# Patient Record
Sex: Male | Born: 1945 | Race: White | Hispanic: No | Marital: Married | Smoking: Former smoker
Health system: Southern US, Community
[De-identification: ages and names within clinical notes are randomized; demographics above are authoritative.]

## PROBLEM LIST (undated history)

## (undated) DIAGNOSIS — G589 Mononeuropathy, unspecified: Secondary | ICD-10-CM

## (undated) DIAGNOSIS — S42409A Unspecified fracture of lower end of unspecified humerus, initial encounter for closed fracture: Secondary | ICD-10-CM

## (undated) DIAGNOSIS — K219 Gastro-esophageal reflux disease without esophagitis: Secondary | ICD-10-CM

## (undated) DIAGNOSIS — C801 Malignant (primary) neoplasm, unspecified: Secondary | ICD-10-CM

## (undated) HISTORY — DX: Gastro-esophageal reflux disease without esophagitis: K21.9

## (undated) HISTORY — DX: Mononeuropathy, unspecified: G58.9

## (undated) HISTORY — DX: Unspecified fracture of lower end of unspecified humerus, initial encounter for closed fracture: S42.409A

---

## 2011-06-19 ENCOUNTER — Other Ambulatory Visit: Payer: Self-pay

## 2011-06-19 ENCOUNTER — Emergency Department (HOSPITAL_BASED_OUTPATIENT_CLINIC_OR_DEPARTMENT_OTHER)
Admission: EM | Admit: 2011-06-19 | Discharge: 2011-06-19 | Disposition: A | Discharge status: Home / Self Care | Payer: BC Managed Care – PPO | Attending: Emergency Medicine | Admitting: Emergency Medicine

## 2011-06-19 ENCOUNTER — Emergency Department (INDEPENDENT_AMBULATORY_CARE_PROVIDER_SITE_OTHER): Payer: BC Managed Care – PPO

## 2011-06-19 DIAGNOSIS — R0602 Shortness of breath: Secondary | ICD-10-CM

## 2011-06-19 DIAGNOSIS — Z79899 Other long term (current) drug therapy: Secondary | ICD-10-CM | POA: Insufficient documentation

## 2011-06-19 DIAGNOSIS — IMO0001 Reserved for inherently not codable concepts without codable children: Secondary | ICD-10-CM

## 2011-06-19 LAB — CBC
MCH: 33.8 pg (ref 26.0–34.0)
MCHC: 35 g/dL (ref 30.0–36.0)
Platelets: 211 10*3/uL (ref 150–400)
RBC: 4.79 MIL/uL (ref 4.22–5.81)

## 2011-06-19 LAB — COMPREHENSIVE METABOLIC PANEL
ALT: 30 U/L (ref 0–53)
AST: 19 U/L (ref 0–37)
Calcium: 10.4 mg/dL (ref 8.4–10.5)
Potassium: 4.7 mEq/L (ref 3.5–5.1)
Sodium: 141 mEq/L (ref 135–145)
Total Protein: 7 g/dL (ref 6.0–8.3)

## 2011-06-19 LAB — CARDIAC PANEL(CRET KIN+CKTOT+MB+TROPI)
CK, MB: 2.6 ng/mL (ref 0.3–4.0)
Relative Index: 2.7 — ABNORMAL HIGH (ref 0.0–2.5)
Total CK: 96 U/L (ref 7–232)
Troponin I: <0.3 ng/mL (ref <0.30)
Troponin I: <0.3 ng/mL (ref <0.30)

## 2011-06-19 NOTE — ED Notes (Signed)
C/o episode of nausea, breathing difficulty, plae diaphoresis approx 430-felt better after belching-denies pain now or with event

## 2011-06-19 NOTE — ED Provider Notes (Addendum)
History     Chief Complaint  Patient presents with  . Breathing Problem   Patient is a 65 y.o. male presenting with difficulty breathing. The history is provided by the patient.  Breathing Problem This is a new problem. The current episode started 3 to 5 hours ago. The problem occurs rarely. The problem has not changed since onset.Associated symptoms include shortness of breath. Pertinent negatives include no chest pain and no abdominal pain. The symptoms are aggravated by nothing. The symptoms are relieved by nothing (burping). He has tried nothing for the symptoms.  Patient states he had momentary episode of feeling like he was short of breath and when he burped it resolved.  History reviewed. No pertinent past medical history.  History reviewed. No pertinent past surgical history.  No family history on file.  History  Substance Use Topics  . Smoking status: Former Games developer  . Smokeless tobacco: Not on file  . Alcohol Use: Yes      Review of Systems  Respiratory: Positive for shortness of breath.   Cardiovascular: Negative for chest pain.  Gastrointestinal: Negative for abdominal pain.    Physical Exam  BP 140/75  Pulse 62  Temp(Src) 98.4 F (36.9 C) (Oral)  Resp 20  Ht 5\' 9"  (1.753 m)  Wt 175 lb (79.379 kg)  BMI 25.84 kg/m2  SpO2 98%  Physical Exam  Constitutional: He is oriented to person, place, and time. He appears well-developed and well-nourished.  HENT:  Head: Normocephalic and atraumatic.  Eyes: Pupils are equal, round, and reactive to light.  Neck: Normal range of motion.  Cardiovascular: Normal rate and regular rhythm.   Pulmonary/Chest: Effort normal and breath sounds normal.  Abdominal: Soft. Bowel sounds are normal.  Musculoskeletal: Normal range of motion.  Neurological: He is alert and oriented to person, place, and time.  Skin: Skin is warm and dry.  Psychiatric: He has a normal mood and affect.    ED Course  Procedures  MDM  Date:  06/19/2011  Rate: 76  Rhythm: normal sinus rhythm  QRS Axis: normal  Intervals: normal  ST/T Wave abnormalities: normal  Conduction Disutrbances:none  Narrative Interpretation:   Old EKG Reviewed: none available   Patient with normal labs ekg and cxr.  Plan referral to pmd.    Hilario Quarry, MD 06/19/11 1610  Hilario Quarry, MD 06/19/11 9604  Hilario Quarry, MD 07/11/11 1227

## 2013-12-28 ENCOUNTER — Encounter: Payer: Self-pay | Admitting: *Deleted

## 2015-03-29 DIAGNOSIS — H2510 Age-related nuclear cataract, unspecified eye: Secondary | ICD-10-CM | POA: Diagnosis not present

## 2015-03-29 DIAGNOSIS — H5210 Myopia, unspecified eye: Secondary | ICD-10-CM | POA: Diagnosis not present

## 2015-03-29 DIAGNOSIS — Z0100 Encounter for examination of eyes and vision without abnormal findings: Secondary | ICD-10-CM | POA: Diagnosis not present

## 2015-12-19 DIAGNOSIS — H25013 Cortical age-related cataract, bilateral: Secondary | ICD-10-CM | POA: Diagnosis not present

## 2015-12-19 DIAGNOSIS — H2513 Age-related nuclear cataract, bilateral: Secondary | ICD-10-CM | POA: Diagnosis not present

## 2015-12-19 DIAGNOSIS — H25043 Posterior subcapsular polar age-related cataract, bilateral: Secondary | ICD-10-CM | POA: Diagnosis not present

## 2015-12-19 DIAGNOSIS — H2511 Age-related nuclear cataract, right eye: Secondary | ICD-10-CM | POA: Diagnosis not present

## 2016-10-13 ENCOUNTER — Emergency Department (INDEPENDENT_AMBULATORY_CARE_PROVIDER_SITE_OTHER)
Admission: EM | Admit: 2016-10-13 | Discharge: 2016-10-13 | Disposition: A | Discharge status: Home / Self Care | Payer: Self-pay | Source: Home / Self Care | Attending: Family Medicine | Admitting: Family Medicine

## 2016-10-13 ENCOUNTER — Encounter: Payer: Self-pay | Admitting: Emergency Medicine

## 2016-10-13 ENCOUNTER — Emergency Department (INDEPENDENT_AMBULATORY_CARE_PROVIDER_SITE_OTHER): Payer: Self-pay

## 2016-10-13 DIAGNOSIS — S39012A Strain of muscle, fascia and tendon of lower back, initial encounter: Secondary | ICD-10-CM

## 2016-10-13 DIAGNOSIS — S161XXA Strain of muscle, fascia and tendon at neck level, initial encounter: Secondary | ICD-10-CM

## 2016-10-13 DIAGNOSIS — M542 Cervicalgia: Secondary | ICD-10-CM
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
## 2016-10-13 NOTE — Discharge Instructions (Addendum)
Apply ice pack for 20 to 30 minutes, 2 to 3 times daily.  Begin neck range of motion and stretching exercises as tolerated.  May take Ibuprofen 200mg , 4 tabs every 8 hours with food.

## 2017-02-19 DIAGNOSIS — H2511 Age-related nuclear cataract, right eye: Secondary | ICD-10-CM | POA: Diagnosis not present

## 2017-02-20 DIAGNOSIS — H2512 Age-related nuclear cataract, left eye: Secondary | ICD-10-CM | POA: Diagnosis not present

## 2017-02-26 DIAGNOSIS — H2511 Age-related nuclear cataract, right eye: Secondary | ICD-10-CM | POA: Diagnosis not present

## 2017-02-26 DIAGNOSIS — H2512 Age-related nuclear cataract, left eye: Secondary | ICD-10-CM | POA: Diagnosis not present

## 2017-02-26 DIAGNOSIS — H251 Age-related nuclear cataract: Secondary | ICD-10-CM | POA: Diagnosis not present
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
## 2017-08-22 DIAGNOSIS — R69 Illness, unspecified: Secondary | ICD-10-CM | POA: Diagnosis not present

## 2017-09-18 DIAGNOSIS — R69 Illness, unspecified: Secondary | ICD-10-CM | POA: Diagnosis not present

## 2017-12-27 ENCOUNTER — Emergency Department
Admission: EM | Admit: 2017-12-27 | Discharge: 2017-12-27 | Disposition: A | Discharge status: Home / Self Care | Payer: Medicare HMO | Source: Home / Self Care | Attending: Family Medicine | Admitting: Family Medicine

## 2017-12-27 ENCOUNTER — Other Ambulatory Visit: Payer: Self-pay

## 2017-12-27 ENCOUNTER — Encounter: Payer: Self-pay | Admitting: Emergency Medicine

## 2017-12-27 DIAGNOSIS — J019 Acute sinusitis, unspecified: Secondary | ICD-10-CM

## 2017-12-27 DIAGNOSIS — B9689 Other specified bacterial agents as the cause of diseases classified elsewhere: Secondary | ICD-10-CM | POA: Diagnosis not present

## 2017-12-27 MED ORDER — IPRATROPIUM BROMIDE 0.06 % NA SOLN: 2.0000 | Freq: Four times a day (QID) | NASAL | 1 refills | Status: DC

## 2017-12-27 MED ORDER — AMOXICILLIN-POT CLAVULANATE 875-125 MG PO TABS: 1.0000 | ORAL_TABLET | Freq: Two times a day (BID) | ORAL | 0 refills | Status: DC

## 2017-12-27 NOTE — ED Provider Notes (Signed)
[name]    CSN: [id number] Arrival date & time: 12/27/17  8466     History   Chief Complaint Chief Complaint  Patient presents with  . Nasal Congestion    HPI Timothy Cox is a 72 y.o. male.   HPI  Timothy Cox is a 72 y.o. male presenting to UC with c/o 4-5 weeks of sinus congestion, PND and sinus pressure.  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 He is worried he has a sinus infection due to the mold. Denies fever, chills, n/v/d. He took ibuprofen 200mg  at 7AM this morning. No known sick contacts.    Past Medical History:  Diagnosis Date  . Fractured elbow   . GERD (gastroesophageal reflux disease)   . Pinched nerve     There are no active problems to display for this patient.   History reviewed. No pertinent surgical history.     Home Medications    Prior to Admission medications   Medication Sig Start Date End Date Taking? Authorizing Provider  amoxicillin-clavulanate (AUGMENTIN) 875-125 MG tablet Take 1 tablet by mouth 2 (two) times daily. One po bid x 7 days 12/27/17   Noe Gens, PA-C  aspirin 81 MG EC tablet Take 81 mg by mouth daily.      [provider]  cholecalciferol (VITAMIN D) 1000 UNITS tablet Take 1,000 Units by mouth daily.    [provider]  Garlic 599 MG TABS Take 1 tablet by mouth daily.      [provider]  ibuprofen (ADVIL,MOTRIN) 200 MG tablet Take 400 mg by mouth as needed. Pain      [provider]  ipratropium (ATROVENT) 0.06 % nasal spray Place 2 sprays into both nostrils 4 (four) times daily. 12/27/17   Noe Gens, PA-C  loratadine-pseudoephedrine (CLARITIN-D 24-HOUR) 10-240 MG per 24 hr tablet Take 1 tablet by mouth as needed. Seasonal allergies     [provider]  Multiple Vitamin (MULTIVITAMIN) tablet Take 1 tablet by mouth daily.      [provider]    Family History Family History  Problem Relation Age  of Onset  . Diabetes Mellitus I Mother   . Heart disease Mother     Social History Social History   Tobacco Use  . Smoking status: Former Smoker    Last attempt to quit: 04/27/2010    Years since quitting: 7.6  . Smokeless tobacco: Never Used  Substance Use Topics  . Alcohol use: Yes  . Drug use: Not on file     Allergies   Patient has no known allergies.   Review of Systems Review of Systems  Constitutional: Negative for chills and fever.  HENT: Positive for congestion, ear pain, postnasal drip, sinus pressure, sinus pain and sneezing. Negative for sore throat, trouble swallowing and voice change.   Respiratory: Negative for cough and shortness of breath.   Cardiovascular: Negative for chest pain and palpitations.  Gastrointestinal: Negative for abdominal pain, diarrhea, nausea and vomiting.  Musculoskeletal: Negative for arthralgias, back pain and myalgias.  Skin: Negative for rash.  Neurological: Positive for headaches. Negative for dizziness and light-headedness.     Physical Exam Triage Vital Signs ED Triage Vitals  Enc Vitals Group     BP 12/27/17 0950 136/65     Pulse Rate 12/27/17 0950 62     Resp 12/27/17 0950 18     Temp 12/27/17 0950 98 F (  36.7 C)     Temp Source 12/27/17 0950 Oral     SpO2 12/27/17 0950 97 %     Weight 12/27/17 0951 162 lb (73.5 kg)     Height 12/27/17 0951 5\' 9"  (1.753 m)     Head Circumference --      Peak Flow --      Pain Score --      Pain Loc --      Pain Edu? --      Excl. in [place]? --    No data found.  Updated Vital Signs BP 136/65 (BP Location: Right Arm)   Pulse 62   Temp 98 F (36.7 C) (Oral)   Resp 18   Ht 5\' 9"  (1.753 m)   Wt 162 lb (73.5 kg)   SpO2 97%   BMI 23.92 kg/m   Visual Acuity Right Eye Distance:   Left Eye Distance:   Bilateral Distance:    Right Eye Near:   Left Eye Near:    Bilateral Near:     Physical Exam  Constitutional: He is oriented to person, place, and time. He appears  well-developed and well-nourished. No distress.  HENT:  Head: Normocephalic and atraumatic.  Right Ear: Tympanic membrane normal.  Left Ear: Tympanic membrane normal.  Nose: Mucosal edema present. Right sinus exhibits maxillary sinus tenderness and frontal sinus tenderness. Left sinus exhibits maxillary sinus tenderness and frontal sinus tenderness.  Mouth/Throat: Uvula is midline and mucous membranes are normal. Posterior oropharyngeal erythema present. No oropharyngeal exudate, posterior oropharyngeal edema or tonsillar abscesses.  Eyes: EOM are normal.  Neck: Normal range of motion. Neck supple.  Cardiovascular: Normal rate and regular rhythm.  Pulmonary/Chest: Effort normal and breath sounds normal. No stridor. No respiratory distress. He has no wheezes. He has no rales.  Musculoskeletal: Normal range of motion.  Lymphadenopathy:    He has cervical adenopathy (Right anterior).  Neurological: He is alert and oriented to person, place, and time.  Skin: Skin is warm and dry. He is not diaphoretic.  Psychiatric: He has a normal mood and affect. His behavior is normal.  Nursing note and vitals reviewed.    UC Treatments / Results  Labs (all labs ordered are listed, but only abnormal results are displayed) Labs Reviewed - No data to display  EKG  EKG Interpretation None       Radiology No results found.  Procedures Procedures (including critical care time)  Medications Ordered in UC Medications - No data to display   Initial Impression / Assessment and Plan / UC Course  I have reviewed the triage vital signs and the nursing notes.  Pertinent labs & imaging results that were available during my care of the patient were reviewed by me and considered in my medical decision making (see chart for details).    Hx and exam c/w sinusitis Will tx with Augmentin  F/u with PCP in 1 week if needed.   Final Clinical Impressions(s) / UC Diagnoses   Final diagnoses:  Acute  bacterial rhinosinusitis    ED Discharge Orders        Ordered    amoxicillin-clavulanate (AUGMENTIN) 875-125 MG tablet  2 times daily     12/27/17 1009    ipratropium (ATROVENT) 0.06 % nasal spray  4 times daily     12/27/17 1009       Controlled Substance Prescriptions [place] Controlled Substance Registry consulted? Not Applicable   Tyrell Antonio 12/27/17 1037

## 2017-12-27 NOTE — Discharge Instructions (Signed)
°  Please take antibiotics as prescribed and be sure to complete entire course even if you start to feel better to ensure infection does not come back. ° °

## 2017-12-27 NOTE — ED Triage Notes (Signed)
Reports congested sinuses with PND for 4-5 weeks; inhaled mold recently and feels he has a sinus infection. Took ibuprofen 200 mg po at 0700.

## 2017-12-29 ENCOUNTER — Telehealth: Payer: Self-pay | Admitting: Emergency Medicine

## 2017-12-29 NOTE — Telephone Encounter (Signed)
Spoke with patient he is improving continues to take medication.

## 2018-01-09 ENCOUNTER — Telehealth: Payer: Self-pay | Admitting: *Deleted

## 2018-01-09 MED ORDER — AMOXICILLIN-POT CLAVULANATE 875-125 MG PO TABS: 1.0000 | ORAL_TABLET | Freq: Two times a day (BID) | ORAL | 0 refills | Status: DC

## 2018-01-09 MED ORDER — FLUTICASONE PROPIONATE 50 MCG/ACT NA SUSP: 2.0000 | Freq: Every day | NASAL | 0 refills | Status: DC

## 2018-01-09 NOTE — Telephone Encounter (Signed)
Pt came into the clinic request more ABT and a different nasal spray. Per Dr Assunta Found sent new rx. If he fails to improve f/u with PCP.

## 2018-01-17 ENCOUNTER — Encounter: Payer: Self-pay | Admitting: Emergency Medicine

## 2018-01-17 ENCOUNTER — Emergency Department
Admission: EM | Admit: 2018-01-17 | Discharge: 2018-01-17 | Disposition: A | Discharge status: Home / Self Care | Payer: Medicare HMO | Source: Home / Self Care | Attending: Family Medicine | Admitting: Family Medicine

## 2018-01-17 ENCOUNTER — Other Ambulatory Visit: Payer: Self-pay

## 2018-01-17 DIAGNOSIS — R590 Localized enlarged lymph nodes: Secondary | ICD-10-CM

## 2018-01-17 DIAGNOSIS — C099 Malignant neoplasm of tonsil, unspecified: Secondary | ICD-10-CM | POA: Diagnosis not present

## 2018-01-17 DIAGNOSIS — J019 Acute sinusitis, unspecified: Secondary | ICD-10-CM | POA: Diagnosis not present

## 2018-01-17 DIAGNOSIS — C109 Malignant neoplasm of oropharynx, unspecified: Secondary | ICD-10-CM | POA: Diagnosis not present

## 2018-01-17 DIAGNOSIS — J351 Hypertrophy of tonsils: Secondary | ICD-10-CM

## 2018-01-17 NOTE — ED Provider Notes (Signed)
[facility]    CSN: [id number] Arrival date & time: 01/17/18  0845     History   Chief Complaint Chief Complaint  Patient presents with  . Sinusitis    HPI Timothy Cox is a 72 y.o. male.   Patient was treated on 12/27/17 for sinusitis with amoxicillin and Atrovent nasal spray.  He reports slight improvement in his congestion after treatment, but complains of persistent swelling in his neck, mild throat soreness on the right, and hoarseness.  No fevers, chills, and sweats.  No cough. He continue to smoke.   The history is provided by the patient.    Past Medical History:  Diagnosis Date  . Fractured elbow   . GERD (gastroesophageal reflux disease)   . Pinched nerve     There are no active problems to display for this patient.   History reviewed. No pertinent surgical history.     Home Medications    Prior to Admission medications   Medication Sig Start Date End Date Taking? Authorizing Provider  aspirin 81 MG EC tablet Take 81 mg by mouth daily.      [provider]  cholecalciferol (VITAMIN D) 1000 UNITS tablet Take 1,000 Units by mouth daily.    [provider]  fluticasone (FLONASE) 50 MCG/ACT nasal spray Place 2 sprays into both nostrils daily for 15 days. 01/09/18 01/24/18  Kandra Nicolas, MD  Garlic 017 MG TABS Take 1 tablet by mouth daily.      [provider]  ibuprofen (ADVIL,MOTRIN) 200 MG tablet Take 400 mg by mouth as needed. Pain      [provider]  ipratropium (ATROVENT) 0.06 % nasal spray Place 2 sprays into both nostrils 4 (four) times daily. 12/27/17   Noe Gens, PA-C  loratadine-pseudoephedrine (CLARITIN-D 24-HOUR) 10-240 MG per 24 hr tablet Take 1 tablet by mouth as needed. Seasonal allergies     [provider]  Multiple Vitamin (MULTIVITAMIN) tablet Take 1 tablet by mouth daily.      [provider]    Family History Family History  Problem Relation Age of Onset  .  Diabetes Mellitus I Mother   . Heart disease Mother     Social History Social History   Tobacco Use  . Smoking status: Former Smoker    Last attempt to quit: 04/27/2010    Years since quitting: 7.7  . Smokeless tobacco: Never Used  Substance Use Topics  . Alcohol use: Yes  . Drug use: Not on file     Allergies   Patient has no known allergies.   Review of Systems Review of Systems + sore throat No cough No pleuritic pain No wheezing + nasal congestion + post-nasal drainage ? sinus pain/pressure No itchy/red eyes No earache No hemoptysis No SOB No fever/chills No nausea No vomiting No abdominal pain No diarrhea No urinary symptoms No skin rash No fatigue No myalgias No headache Used OTC meds without relief   Physical Exam Triage Vital Signs ED Triage Vitals  Enc Vitals Group     BP 01/17/18 0924 138/76     Pulse Rate 01/17/18 0924 69     Resp --      Temp 01/17/18 0924 98.1 F (36.7 C)     Temp Source 01/17/18 0924 Oral     SpO2 01/17/18 0924 98 %     Weight 01/17/18 0925 158 lb (71.7 kg)     Height 01/17/18 0925 5\' 9"  (1.753 m)  Head Circumference --      Peak Flow --      Pain Score --      Pain Loc --      Pain Edu? --      Excl. in [place]? --    No data found.  Updated Vital Signs BP 138/76 (BP Location: Right Arm)   Pulse 69   Temp 98.1 F (36.7 C) (Oral)   Ht 5\' 9"  (1.753 m)   Wt 158 lb (71.7 kg)   SpO2 98%   BMI 23.33 kg/m   Visual Acuity Right Eye Distance:   Left Eye Distance:   Bilateral Distance:    Right Eye Near:   Left Eye Near:    Bilateral Near:     Physical Exam  Constitutional: He appears well-developed and well-nourished. No distress.  HENT:  Head: Normocephalic.    Right Ear: Tympanic membrane, external ear and ear canal normal.  Left Ear: Tympanic membrane, external ear and ear canal normal.  Mouth/Throat: Uvula swelling present. Posterior oropharyngeal erythema present. Tonsils are 3+ on the right.  Tonsils are 1+ on the left.    Matted nontender right post-auricular node.  Firm, nontender right lateral cervical node. Left lateral nodes prominent.  Right tonsil erythematous and swollen.  Eyes: EOM are normal. Pupils are equal, round, and reactive to light. Right eye exhibits no discharge. Left eye exhibits no discharge.  Neck: Neck supple.  Cardiovascular: Normal heart sounds.  Pulmonary/Chest: Breath sounds normal.  Musculoskeletal: He exhibits no edema.  Neurological: He is alert.  Skin: Skin is warm and dry.  Nursing note and vitals reviewed.    UC Treatments / Results  Labs (all labs ordered are listed, but only abnormal results are displayed) Labs Reviewed - No data to display  EKG  EKG Interpretation None       Radiology No results found.  Procedures Procedures (including critical care time)  Medications Ordered in UC Medications - No data to display   Initial Impression / Assessment and Plan / UC Course  I have reviewed the triage vital signs and the nursing notes.  Pertinent labs & imaging results that were available during my care of the patient were reviewed by me and considered in my medical decision making (see chart for details).    Concern for throat CA. Will refer to Dr. Jannifer Franklin (ENT) 2:15pm today for further evaluation    Final Clinical Impressions(s) / UC Diagnoses   Final diagnoses:  Cervical adenopathy  Tonsillar hypertrophy, unilateral    ED Discharge Orders    None           Kandra Nicolas, MD 01/17/18 1109

## 2018-01-17 NOTE — ED Triage Notes (Signed)
Sinus congestion with green mucus, works around horses and hay with mold. Has been going on for several weeks.

## 2018-01-18 ENCOUNTER — Telehealth: Payer: Self-pay | Admitting: Emergency Medicine

## 2018-01-18 NOTE — Telephone Encounter (Signed)
Patient reports he did go to ENT yesterday and they took a biopsy of lesion and will know by 01/21/18 if there is a cancerous lesion.

## 2018-01-24 DIAGNOSIS — F17200 Nicotine dependence, unspecified, uncomplicated: Secondary | ICD-10-CM | POA: Diagnosis not present

## 2018-01-24 DIAGNOSIS — J3089 Other allergic rhinitis: Secondary | ICD-10-CM | POA: Diagnosis not present

## 2018-01-24 DIAGNOSIS — Z8052 Family history of malignant neoplasm of bladder: Secondary | ICD-10-CM | POA: Diagnosis not present

## 2018-01-24 DIAGNOSIS — C099 Malignant neoplasm of tonsil, unspecified: Secondary | ICD-10-CM | POA: Diagnosis not present

## 2018-01-24 DIAGNOSIS — F17210 Nicotine dependence, cigarettes, uncomplicated: Secondary | ICD-10-CM | POA: Diagnosis not present

## 2018-01-24 DIAGNOSIS — Z801 Family history of malignant neoplasm of trachea, bronchus and lung: Secondary | ICD-10-CM | POA: Diagnosis not present

## 2018-01-24 DIAGNOSIS — Z803 Family history of malignant neoplasm of breast: Secondary | ICD-10-CM | POA: Diagnosis not present

## 2018-01-30 DIAGNOSIS — R918 Other nonspecific abnormal finding of lung field: Secondary | ICD-10-CM | POA: Diagnosis not present

## 2018-01-30 DIAGNOSIS — K7290 Hepatic failure, unspecified without coma: Secondary | ICD-10-CM | POA: Diagnosis not present

## 2018-01-30 DIAGNOSIS — C109 Malignant neoplasm of oropharynx, unspecified: Secondary | ICD-10-CM | POA: Diagnosis not present

## 2018-01-30 DIAGNOSIS — R590 Localized enlarged lymph nodes: Secondary | ICD-10-CM | POA: Diagnosis not present

## 2018-01-30 DIAGNOSIS — C099 Malignant neoplasm of tonsil, unspecified: Secondary | ICD-10-CM | POA: Diagnosis not present

## 2018-01-31 DIAGNOSIS — C109 Malignant neoplasm of oropharynx, unspecified: Secondary | ICD-10-CM | POA: Diagnosis not present

## 2018-01-31 DIAGNOSIS — G4730 Sleep apnea, unspecified: Secondary | ICD-10-CM | POA: Diagnosis not present

## 2018-01-31 DIAGNOSIS — Z87891 Personal history of nicotine dependence: Secondary | ICD-10-CM | POA: Diagnosis not present

## 2018-02-05 DIAGNOSIS — C099 Malignant neoplasm of tonsil, unspecified: Secondary | ICD-10-CM | POA: Diagnosis not present

## 2018-02-05 DIAGNOSIS — F17200 Nicotine dependence, unspecified, uncomplicated: Secondary | ICD-10-CM | POA: Diagnosis not present

## 2018-02-11 DIAGNOSIS — Z452 Encounter for adjustment and management of vascular access device: Secondary | ICD-10-CM | POA: Diagnosis not present

## 2018-02-11 DIAGNOSIS — C099 Malignant neoplasm of tonsil, unspecified: Secondary | ICD-10-CM | POA: Diagnosis not present

## 2018-02-24 DIAGNOSIS — Z5111 Encounter for antineoplastic chemotherapy: Secondary | ICD-10-CM | POA: Diagnosis not present

## 2018-02-24 DIAGNOSIS — T451X5A Adverse effect of antineoplastic and immunosuppressive drugs, initial encounter: Secondary | ICD-10-CM | POA: Diagnosis not present

## 2018-02-24 DIAGNOSIS — C770 Secondary and unspecified malignant neoplasm of lymph nodes of head, face and neck: Secondary | ICD-10-CM | POA: Diagnosis not present

## 2018-02-24 DIAGNOSIS — C099 Malignant neoplasm of tonsil, unspecified: Secondary | ICD-10-CM | POA: Diagnosis not present

## 2018-02-24 DIAGNOSIS — D701 Agranulocytosis secondary to cancer chemotherapy: Secondary | ICD-10-CM | POA: Diagnosis not present

## 2018-02-24 DIAGNOSIS — K121 Other forms of stomatitis: Secondary | ICD-10-CM | POA: Diagnosis not present

## 2018-02-24 DIAGNOSIS — R911 Solitary pulmonary nodule: Secondary | ICD-10-CM | POA: Diagnosis not present

## 2018-02-24 DIAGNOSIS — G893 Neoplasm related pain (acute) (chronic): Secondary | ICD-10-CM | POA: Diagnosis not present

## 2018-02-24 DIAGNOSIS — Z801 Family history of malignant neoplasm of trachea, bronchus and lung: Secondary | ICD-10-CM | POA: Diagnosis not present

## 2018-02-24 DIAGNOSIS — R591 Generalized enlarged lymph nodes: Secondary | ICD-10-CM | POA: Diagnosis not present

## 2018-02-24 DIAGNOSIS — Z87891 Personal history of nicotine dependence: Secondary | ICD-10-CM | POA: Diagnosis not present

## 2018-02-24 DIAGNOSIS — C7801 Secondary malignant neoplasm of right lung: Secondary | ICD-10-CM | POA: Diagnosis not present

## 2018-02-24 DIAGNOSIS — Z8052 Family history of malignant neoplasm of bladder: Secondary | ICD-10-CM | POA: Diagnosis not present

## 2018-02-24 DIAGNOSIS — R112 Nausea with vomiting, unspecified: Secondary | ICD-10-CM | POA: Diagnosis not present

## 2018-02-24 DIAGNOSIS — C771 Secondary and unspecified malignant neoplasm of intrathoracic lymph nodes: Secondary | ICD-10-CM | POA: Diagnosis not present

## 2018-02-28 DIAGNOSIS — R591 Generalized enlarged lymph nodes: Secondary | ICD-10-CM | POA: Diagnosis not present

## 2018-02-28 DIAGNOSIS — C099 Malignant neoplasm of tonsil, unspecified: Secondary | ICD-10-CM | POA: Diagnosis not present

## 2018-02-28 DIAGNOSIS — G893 Neoplasm related pain (acute) (chronic): Secondary | ICD-10-CM | POA: Diagnosis not present

## 2018-02-28 DIAGNOSIS — D701 Agranulocytosis secondary to cancer chemotherapy: Secondary | ICD-10-CM | POA: Diagnosis not present

## 2018-02-28 DIAGNOSIS — R112 Nausea with vomiting, unspecified: Secondary | ICD-10-CM | POA: Diagnosis not present

## 2018-02-28 DIAGNOSIS — Z5111 Encounter for antineoplastic chemotherapy: Secondary | ICD-10-CM | POA: Diagnosis not present

## 2018-02-28 DIAGNOSIS — T451X5A Adverse effect of antineoplastic and immunosuppressive drugs, initial encounter: Secondary | ICD-10-CM | POA: Diagnosis not present

## 2018-02-28 DIAGNOSIS — R911 Solitary pulmonary nodule: Secondary | ICD-10-CM | POA: Diagnosis not present

## 2018-03-05 DIAGNOSIS — R591 Generalized enlarged lymph nodes: Secondary | ICD-10-CM | POA: Diagnosis not present

## 2018-03-05 DIAGNOSIS — Z5111 Encounter for antineoplastic chemotherapy: Secondary | ICD-10-CM | POA: Diagnosis not present

## 2018-03-05 DIAGNOSIS — R112 Nausea with vomiting, unspecified: Secondary | ICD-10-CM | POA: Diagnosis not present

## 2018-03-05 DIAGNOSIS — R911 Solitary pulmonary nodule: Secondary | ICD-10-CM | POA: Diagnosis not present

## 2018-03-05 DIAGNOSIS — G893 Neoplasm related pain (acute) (chronic): Secondary | ICD-10-CM | POA: Diagnosis not present

## 2018-03-05 DIAGNOSIS — T451X5A Adverse effect of antineoplastic and immunosuppressive drugs, initial encounter: Secondary | ICD-10-CM | POA: Diagnosis not present

## 2018-03-05 DIAGNOSIS — C099 Malignant neoplasm of tonsil, unspecified: Secondary | ICD-10-CM | POA: Diagnosis not present

## 2018-03-05 DIAGNOSIS — D701 Agranulocytosis secondary to cancer chemotherapy: Secondary | ICD-10-CM | POA: Diagnosis not present

## 2018-03-14 DIAGNOSIS — R112 Nausea with vomiting, unspecified: Secondary | ICD-10-CM | POA: Diagnosis not present

## 2018-03-14 DIAGNOSIS — C099 Malignant neoplasm of tonsil, unspecified: Secondary | ICD-10-CM | POA: Diagnosis not present

## 2018-03-14 DIAGNOSIS — R591 Generalized enlarged lymph nodes: Secondary | ICD-10-CM | POA: Diagnosis not present

## 2018-03-14 DIAGNOSIS — R911 Solitary pulmonary nodule: Secondary | ICD-10-CM | POA: Diagnosis not present

## 2018-03-14 DIAGNOSIS — T451X5A Adverse effect of antineoplastic and immunosuppressive drugs, initial encounter: Secondary | ICD-10-CM | POA: Diagnosis not present

## 2018-03-14 DIAGNOSIS — D701 Agranulocytosis secondary to cancer chemotherapy: Secondary | ICD-10-CM | POA: Diagnosis not present

## 2018-03-14 DIAGNOSIS — Z5111 Encounter for antineoplastic chemotherapy: Secondary | ICD-10-CM | POA: Diagnosis not present

## 2018-03-14 DIAGNOSIS — G893 Neoplasm related pain (acute) (chronic): Secondary | ICD-10-CM | POA: Diagnosis not present

## 2018-03-17 DIAGNOSIS — C7801 Secondary malignant neoplasm of right lung: Secondary | ICD-10-CM | POA: Diagnosis not present

## 2018-03-17 DIAGNOSIS — C770 Secondary and unspecified malignant neoplasm of lymph nodes of head, face and neck: Secondary | ICD-10-CM | POA: Diagnosis not present

## 2018-03-17 DIAGNOSIS — T451X5A Adverse effect of antineoplastic and immunosuppressive drugs, initial encounter: Secondary | ICD-10-CM | POA: Diagnosis not present

## 2018-03-17 DIAGNOSIS — C099 Malignant neoplasm of tonsil, unspecified: Secondary | ICD-10-CM | POA: Diagnosis not present

## 2018-03-17 DIAGNOSIS — D701 Agranulocytosis secondary to cancer chemotherapy: Secondary | ICD-10-CM | POA: Diagnosis not present

## 2018-03-17 DIAGNOSIS — R911 Solitary pulmonary nodule: Secondary | ICD-10-CM | POA: Diagnosis not present

## 2018-03-17 DIAGNOSIS — R591 Generalized enlarged lymph nodes: Secondary | ICD-10-CM | POA: Diagnosis not present

## 2018-03-17 DIAGNOSIS — K121 Other forms of stomatitis: Secondary | ICD-10-CM | POA: Diagnosis not present

## 2018-03-17 DIAGNOSIS — R112 Nausea with vomiting, unspecified: Secondary | ICD-10-CM | POA: Diagnosis not present

## 2018-03-17 DIAGNOSIS — C771 Secondary and unspecified malignant neoplasm of intrathoracic lymph nodes: Secondary | ICD-10-CM | POA: Diagnosis not present

## 2018-03-17 DIAGNOSIS — G893 Neoplasm related pain (acute) (chronic): Secondary | ICD-10-CM | POA: Diagnosis not present

## 2018-03-17 DIAGNOSIS — Z5111 Encounter for antineoplastic chemotherapy: Secondary | ICD-10-CM | POA: Diagnosis not present

## 2018-03-20 DIAGNOSIS — Z5111 Encounter for antineoplastic chemotherapy: Secondary | ICD-10-CM | POA: Diagnosis not present

## 2018-03-20 DIAGNOSIS — Z87891 Personal history of nicotine dependence: Secondary | ICD-10-CM | POA: Diagnosis not present

## 2018-03-20 DIAGNOSIS — C099 Malignant neoplasm of tonsil, unspecified: Secondary | ICD-10-CM | POA: Diagnosis not present

## 2018-03-20 DIAGNOSIS — D6959 Other secondary thrombocytopenia: Secondary | ICD-10-CM | POA: Diagnosis not present

## 2018-03-20 DIAGNOSIS — D701 Agranulocytosis secondary to cancer chemotherapy: Secondary | ICD-10-CM | POA: Diagnosis not present

## 2018-03-20 DIAGNOSIS — T451X5A Adverse effect of antineoplastic and immunosuppressive drugs, initial encounter: Secondary | ICD-10-CM | POA: Diagnosis not present

## 2018-03-20 DIAGNOSIS — D6481 Anemia due to antineoplastic chemotherapy: Secondary | ICD-10-CM | POA: Diagnosis not present

## 2018-03-20 DIAGNOSIS — G893 Neoplasm related pain (acute) (chronic): Secondary | ICD-10-CM | POA: Diagnosis not present

## 2018-03-26 DIAGNOSIS — C109 Malignant neoplasm of oropharynx, unspecified: Secondary | ICD-10-CM | POA: Diagnosis not present

## 2018-03-26 DIAGNOSIS — Z87891 Personal history of nicotine dependence: Secondary | ICD-10-CM | POA: Diagnosis not present

## 2018-03-26 DIAGNOSIS — C099 Malignant neoplasm of tonsil, unspecified: Secondary | ICD-10-CM | POA: Diagnosis not present

## 2018-04-04 DIAGNOSIS — Z87891 Personal history of nicotine dependence: Secondary | ICD-10-CM | POA: Diagnosis not present

## 2018-04-04 DIAGNOSIS — T451X5A Adverse effect of antineoplastic and immunosuppressive drugs, initial encounter: Secondary | ICD-10-CM | POA: Diagnosis not present

## 2018-04-04 DIAGNOSIS — Z5111 Encounter for antineoplastic chemotherapy: Secondary | ICD-10-CM | POA: Diagnosis not present

## 2018-04-04 DIAGNOSIS — D701 Agranulocytosis secondary to cancer chemotherapy: Secondary | ICD-10-CM | POA: Diagnosis not present

## 2018-04-04 DIAGNOSIS — D6481 Anemia due to antineoplastic chemotherapy: Secondary | ICD-10-CM | POA: Diagnosis not present

## 2018-04-04 DIAGNOSIS — C099 Malignant neoplasm of tonsil, unspecified: Secondary | ICD-10-CM | POA: Diagnosis not present

## 2018-04-04 DIAGNOSIS — D6959 Other secondary thrombocytopenia: Secondary | ICD-10-CM | POA: Diagnosis not present

## 2018-04-04 DIAGNOSIS — G893 Neoplasm related pain (acute) (chronic): Secondary | ICD-10-CM | POA: Diagnosis not present

## 2018-04-07 DIAGNOSIS — C771 Secondary and unspecified malignant neoplasm of intrathoracic lymph nodes: Secondary | ICD-10-CM | POA: Diagnosis not present

## 2018-04-07 DIAGNOSIS — T451X5A Adverse effect of antineoplastic and immunosuppressive drugs, initial encounter: Secondary | ICD-10-CM | POA: Diagnosis not present

## 2018-04-07 DIAGNOSIS — R112 Nausea with vomiting, unspecified: Secondary | ICD-10-CM | POA: Diagnosis not present

## 2018-04-07 DIAGNOSIS — D701 Agranulocytosis secondary to cancer chemotherapy: Secondary | ICD-10-CM | POA: Diagnosis not present

## 2018-04-07 DIAGNOSIS — Z87891 Personal history of nicotine dependence: Secondary | ICD-10-CM | POA: Diagnosis not present

## 2018-04-07 DIAGNOSIS — D6959 Other secondary thrombocytopenia: Secondary | ICD-10-CM | POA: Diagnosis not present

## 2018-04-07 DIAGNOSIS — G893 Neoplasm related pain (acute) (chronic): Secondary | ICD-10-CM | POA: Diagnosis not present

## 2018-04-07 DIAGNOSIS — D6481 Anemia due to antineoplastic chemotherapy: Secondary | ICD-10-CM | POA: Diagnosis not present

## 2018-04-07 DIAGNOSIS — C099 Malignant neoplasm of tonsil, unspecified: Secondary | ICD-10-CM | POA: Diagnosis not present

## 2018-04-07 DIAGNOSIS — Z5111 Encounter for antineoplastic chemotherapy: Secondary | ICD-10-CM | POA: Diagnosis not present

## 2018-04-11 DIAGNOSIS — T451X5A Adverse effect of antineoplastic and immunosuppressive drugs, initial encounter: Secondary | ICD-10-CM | POA: Diagnosis not present

## 2018-04-11 DIAGNOSIS — D6481 Anemia due to antineoplastic chemotherapy: Secondary | ICD-10-CM | POA: Diagnosis not present

## 2018-04-11 DIAGNOSIS — Z87891 Personal history of nicotine dependence: Secondary | ICD-10-CM | POA: Diagnosis not present

## 2018-04-11 DIAGNOSIS — C099 Malignant neoplasm of tonsil, unspecified: Secondary | ICD-10-CM | POA: Diagnosis not present

## 2018-04-11 DIAGNOSIS — D701 Agranulocytosis secondary to cancer chemotherapy: Secondary | ICD-10-CM | POA: Diagnosis not present

## 2018-04-11 DIAGNOSIS — G893 Neoplasm related pain (acute) (chronic): Secondary | ICD-10-CM | POA: Diagnosis not present

## 2018-04-11 DIAGNOSIS — D6959 Other secondary thrombocytopenia: Secondary | ICD-10-CM | POA: Diagnosis not present

## 2018-04-11 DIAGNOSIS — Z5111 Encounter for antineoplastic chemotherapy: Secondary | ICD-10-CM | POA: Diagnosis not present

## 2018-04-18 DIAGNOSIS — C099 Malignant neoplasm of tonsil, unspecified: Secondary | ICD-10-CM | POA: Diagnosis not present

## 2018-04-18 DIAGNOSIS — K4090 Unilateral inguinal hernia, without obstruction or gangrene, not specified as recurrent: Secondary | ICD-10-CM | POA: Diagnosis not present

## 2018-04-18 DIAGNOSIS — R590 Localized enlarged lymph nodes: Secondary | ICD-10-CM | POA: Diagnosis not present

## 2018-04-18 DIAGNOSIS — R918 Other nonspecific abnormal finding of lung field: Secondary | ICD-10-CM | POA: Diagnosis not present

## 2018-04-18 DIAGNOSIS — K5900 Constipation, unspecified: Secondary | ICD-10-CM | POA: Diagnosis not present

## 2018-04-18 DIAGNOSIS — N281 Cyst of kidney, acquired: Secondary | ICD-10-CM | POA: Diagnosis not present

## 2018-04-18 DIAGNOSIS — C109 Malignant neoplasm of oropharynx, unspecified: Secondary | ICD-10-CM | POA: Diagnosis not present

## 2018-04-18 DIAGNOSIS — C760 Malignant neoplasm of head, face and neck: Secondary | ICD-10-CM | POA: Diagnosis not present

## 2018-04-18 DIAGNOSIS — K7290 Hepatic failure, unspecified without coma: Secondary | ICD-10-CM | POA: Diagnosis not present

## 2018-04-18 DIAGNOSIS — J8410 Pulmonary fibrosis, unspecified: Secondary | ICD-10-CM | POA: Diagnosis not present

## 2018-04-18 DIAGNOSIS — N200 Calculus of kidney: Secondary | ICD-10-CM | POA: Diagnosis not present

## 2018-04-25 DIAGNOSIS — R112 Nausea with vomiting, unspecified: Secondary | ICD-10-CM | POA: Diagnosis not present

## 2018-04-25 DIAGNOSIS — D6481 Anemia due to antineoplastic chemotherapy: Secondary | ICD-10-CM | POA: Diagnosis not present

## 2018-04-25 DIAGNOSIS — T451X5A Adverse effect of antineoplastic and immunosuppressive drugs, initial encounter: Secondary | ICD-10-CM | POA: Diagnosis not present

## 2018-04-25 DIAGNOSIS — D701 Agranulocytosis secondary to cancer chemotherapy: Secondary | ICD-10-CM | POA: Diagnosis not present

## 2018-04-25 DIAGNOSIS — Z5111 Encounter for antineoplastic chemotherapy: Secondary | ICD-10-CM | POA: Diagnosis not present

## 2018-04-25 DIAGNOSIS — G893 Neoplasm related pain (acute) (chronic): Secondary | ICD-10-CM | POA: Diagnosis not present

## 2018-04-25 DIAGNOSIS — C099 Malignant neoplasm of tonsil, unspecified: Secondary | ICD-10-CM | POA: Diagnosis not present

## 2018-04-25 DIAGNOSIS — C799 Secondary malignant neoplasm of unspecified site: Secondary | ICD-10-CM | POA: Diagnosis not present

## 2018-04-25 DIAGNOSIS — D6959 Other secondary thrombocytopenia: Secondary | ICD-10-CM | POA: Diagnosis not present

## 2018-04-28 DIAGNOSIS — K219 Gastro-esophageal reflux disease without esophagitis: Secondary | ICD-10-CM | POA: Diagnosis not present

## 2018-04-28 DIAGNOSIS — T451X5D Adverse effect of antineoplastic and immunosuppressive drugs, subsequent encounter: Secondary | ICD-10-CM | POA: Diagnosis not present

## 2018-04-28 DIAGNOSIS — C099 Malignant neoplasm of tonsil, unspecified: Secondary | ICD-10-CM | POA: Diagnosis not present

## 2018-04-28 DIAGNOSIS — D6481 Anemia due to antineoplastic chemotherapy: Secondary | ICD-10-CM | POA: Diagnosis not present

## 2018-04-28 DIAGNOSIS — D701 Agranulocytosis secondary to cancer chemotherapy: Secondary | ICD-10-CM | POA: Diagnosis not present

## 2018-04-28 DIAGNOSIS — C770 Secondary and unspecified malignant neoplasm of lymph nodes of head, face and neck: Secondary | ICD-10-CM | POA: Diagnosis not present

## 2018-04-28 DIAGNOSIS — Z8052 Family history of malignant neoplasm of bladder: Secondary | ICD-10-CM | POA: Diagnosis not present

## 2018-04-28 DIAGNOSIS — Z862 Personal history of diseases of the blood and blood-forming organs and certain disorders involving the immune mechanism: Secondary | ICD-10-CM | POA: Diagnosis not present

## 2018-04-28 DIAGNOSIS — R112 Nausea with vomiting, unspecified: Secondary | ICD-10-CM | POA: Diagnosis not present

## 2018-04-28 DIAGNOSIS — T451X5A Adverse effect of antineoplastic and immunosuppressive drugs, initial encounter: Secondary | ICD-10-CM | POA: Diagnosis not present

## 2018-04-28 DIAGNOSIS — Z87891 Personal history of nicotine dependence: Secondary | ICD-10-CM | POA: Diagnosis not present

## 2018-04-28 DIAGNOSIS — C799 Secondary malignant neoplasm of unspecified site: Secondary | ICD-10-CM | POA: Diagnosis not present

## 2018-04-28 DIAGNOSIS — G893 Neoplasm related pain (acute) (chronic): Secondary | ICD-10-CM | POA: Diagnosis not present

## 2018-04-28 DIAGNOSIS — K1230 Oral mucositis (ulcerative), unspecified: Secondary | ICD-10-CM | POA: Diagnosis not present

## 2018-04-28 DIAGNOSIS — D6959 Other secondary thrombocytopenia: Secondary | ICD-10-CM | POA: Diagnosis not present

## 2018-04-28 DIAGNOSIS — Z5111 Encounter for antineoplastic chemotherapy: Secondary | ICD-10-CM | POA: Diagnosis not present

## 2018-05-02 DIAGNOSIS — C799 Secondary malignant neoplasm of unspecified site: Secondary | ICD-10-CM | POA: Diagnosis not present

## 2018-05-02 DIAGNOSIS — D6481 Anemia due to antineoplastic chemotherapy: Secondary | ICD-10-CM | POA: Diagnosis not present

## 2018-05-02 DIAGNOSIS — G893 Neoplasm related pain (acute) (chronic): Secondary | ICD-10-CM | POA: Diagnosis not present

## 2018-05-02 DIAGNOSIS — C099 Malignant neoplasm of tonsil, unspecified: Secondary | ICD-10-CM | POA: Diagnosis not present

## 2018-05-02 DIAGNOSIS — R112 Nausea with vomiting, unspecified: Secondary | ICD-10-CM | POA: Diagnosis not present

## 2018-05-02 DIAGNOSIS — Z5111 Encounter for antineoplastic chemotherapy: Secondary | ICD-10-CM | POA: Diagnosis not present

## 2018-05-02 DIAGNOSIS — D701 Agranulocytosis secondary to cancer chemotherapy: Secondary | ICD-10-CM | POA: Diagnosis not present

## 2018-05-02 DIAGNOSIS — T451X5A Adverse effect of antineoplastic and immunosuppressive drugs, initial encounter: Secondary | ICD-10-CM | POA: Diagnosis not present

## 2018-05-02 DIAGNOSIS — D6959 Other secondary thrombocytopenia: Secondary | ICD-10-CM | POA: Diagnosis not present

## 2018-05-16 DIAGNOSIS — R112 Nausea with vomiting, unspecified: Secondary | ICD-10-CM | POA: Diagnosis not present

## 2018-05-16 DIAGNOSIS — C799 Secondary malignant neoplasm of unspecified site: Secondary | ICD-10-CM | POA: Diagnosis not present

## 2018-05-16 DIAGNOSIS — D701 Agranulocytosis secondary to cancer chemotherapy: Secondary | ICD-10-CM | POA: Diagnosis not present

## 2018-05-16 DIAGNOSIS — T451X5A Adverse effect of antineoplastic and immunosuppressive drugs, initial encounter: Secondary | ICD-10-CM | POA: Diagnosis not present

## 2018-05-16 DIAGNOSIS — D6959 Other secondary thrombocytopenia: Secondary | ICD-10-CM | POA: Diagnosis not present

## 2018-05-16 DIAGNOSIS — G893 Neoplasm related pain (acute) (chronic): Secondary | ICD-10-CM | POA: Diagnosis not present

## 2018-05-16 DIAGNOSIS — C099 Malignant neoplasm of tonsil, unspecified: Secondary | ICD-10-CM | POA: Diagnosis not present

## 2018-05-16 DIAGNOSIS — Z5111 Encounter for antineoplastic chemotherapy: Secondary | ICD-10-CM | POA: Diagnosis not present

## 2018-05-16 DIAGNOSIS — D6481 Anemia due to antineoplastic chemotherapy: Secondary | ICD-10-CM | POA: Diagnosis not present

## 2018-05-19 DIAGNOSIS — R112 Nausea with vomiting, unspecified: Secondary | ICD-10-CM | POA: Diagnosis not present

## 2018-05-19 DIAGNOSIS — K1230 Oral mucositis (ulcerative), unspecified: Secondary | ICD-10-CM | POA: Diagnosis not present

## 2018-05-19 DIAGNOSIS — T451X5A Adverse effect of antineoplastic and immunosuppressive drugs, initial encounter: Secondary | ICD-10-CM | POA: Diagnosis not present

## 2018-05-19 DIAGNOSIS — Z9889 Other specified postprocedural states: Secondary | ICD-10-CM | POA: Diagnosis not present

## 2018-05-19 DIAGNOSIS — C099 Malignant neoplasm of tonsil, unspecified: Secondary | ICD-10-CM | POA: Diagnosis not present

## 2018-05-19 DIAGNOSIS — K219 Gastro-esophageal reflux disease without esophagitis: Secondary | ICD-10-CM | POA: Diagnosis not present

## 2018-05-19 DIAGNOSIS — I10 Essential (primary) hypertension: Secondary | ICD-10-CM | POA: Diagnosis not present

## 2018-05-23 DIAGNOSIS — K1230 Oral mucositis (ulcerative), unspecified: Secondary | ICD-10-CM | POA: Diagnosis not present

## 2018-05-23 DIAGNOSIS — C099 Malignant neoplasm of tonsil, unspecified: Secondary | ICD-10-CM | POA: Diagnosis not present

## 2018-05-28 DIAGNOSIS — C099 Malignant neoplasm of tonsil, unspecified: Secondary | ICD-10-CM | POA: Diagnosis not present

## 2018-06-06 DIAGNOSIS — C099 Malignant neoplasm of tonsil, unspecified: Secondary | ICD-10-CM | POA: Diagnosis not present

## 2018-06-09 DIAGNOSIS — R112 Nausea with vomiting, unspecified: Secondary | ICD-10-CM | POA: Diagnosis not present

## 2018-06-09 DIAGNOSIS — Z9889 Other specified postprocedural states: Secondary | ICD-10-CM | POA: Diagnosis not present

## 2018-06-09 DIAGNOSIS — I10 Essential (primary) hypertension: Secondary | ICD-10-CM | POA: Diagnosis not present

## 2018-06-09 DIAGNOSIS — T451X5A Adverse effect of antineoplastic and immunosuppressive drugs, initial encounter: Secondary | ICD-10-CM | POA: Diagnosis not present

## 2018-06-09 DIAGNOSIS — Z5111 Encounter for antineoplastic chemotherapy: Secondary | ICD-10-CM | POA: Diagnosis not present

## 2018-06-09 DIAGNOSIS — C099 Malignant neoplasm of tonsil, unspecified: Secondary | ICD-10-CM | POA: Diagnosis not present

## 2018-06-09 DIAGNOSIS — K219 Gastro-esophageal reflux disease without esophagitis: Secondary | ICD-10-CM | POA: Diagnosis not present

## 2018-06-09 DIAGNOSIS — D6481 Anemia due to antineoplastic chemotherapy: Secondary | ICD-10-CM | POA: Diagnosis not present

## 2018-06-09 DIAGNOSIS — K1230 Oral mucositis (ulcerative), unspecified: Secondary | ICD-10-CM | POA: Diagnosis not present

## 2018-06-09 DIAGNOSIS — Z87891 Personal history of nicotine dependence: Secondary | ICD-10-CM | POA: Diagnosis not present

## 2018-06-13 DIAGNOSIS — C099 Malignant neoplasm of tonsil, unspecified: Secondary | ICD-10-CM | POA: Diagnosis not present

## 2018-06-13 DIAGNOSIS — Z452 Encounter for adjustment and management of vascular access device: Secondary | ICD-10-CM | POA: Diagnosis not present

## 2018-07-07 DIAGNOSIS — Z9889 Other specified postprocedural states: Secondary | ICD-10-CM | POA: Diagnosis not present

## 2018-07-07 DIAGNOSIS — I10 Essential (primary) hypertension: Secondary | ICD-10-CM | POA: Diagnosis not present

## 2018-07-07 DIAGNOSIS — Z95828 Presence of other vascular implants and grafts: Secondary | ICD-10-CM | POA: Diagnosis not present

## 2018-07-07 DIAGNOSIS — Z87891 Personal history of nicotine dependence: Secondary | ICD-10-CM | POA: Diagnosis not present

## 2018-07-07 DIAGNOSIS — C099 Malignant neoplasm of tonsil, unspecified: Secondary | ICD-10-CM | POA: Diagnosis not present

## 2018-07-07 DIAGNOSIS — K219 Gastro-esophageal reflux disease without esophagitis: Secondary | ICD-10-CM | POA: Diagnosis not present

## 2018-07-16 DIAGNOSIS — K4090 Unilateral inguinal hernia, without obstruction or gangrene, not specified as recurrent: Secondary | ICD-10-CM | POA: Diagnosis not present

## 2018-07-16 DIAGNOSIS — C099 Malignant neoplasm of tonsil, unspecified: Secondary | ICD-10-CM | POA: Diagnosis not present

## 2018-07-16 DIAGNOSIS — N200 Calculus of kidney: Secondary | ICD-10-CM | POA: Diagnosis not present

## 2018-07-16 DIAGNOSIS — K7290 Hepatic failure, unspecified without coma: Secondary | ICD-10-CM | POA: Diagnosis not present

## 2018-07-16 DIAGNOSIS — C109 Malignant neoplasm of oropharynx, unspecified: Secondary | ICD-10-CM | POA: Diagnosis not present

## 2018-07-30 DIAGNOSIS — H6123 Impacted cerumen, bilateral: Secondary | ICD-10-CM | POA: Diagnosis not present

## 2018-07-30 DIAGNOSIS — C109 Malignant neoplasm of oropharynx, unspecified: Secondary | ICD-10-CM | POA: Diagnosis not present

## 2018-08-01 DIAGNOSIS — K219 Gastro-esophageal reflux disease without esophagitis: Secondary | ICD-10-CM | POA: Diagnosis not present

## 2018-08-01 DIAGNOSIS — C099 Malignant neoplasm of tonsil, unspecified: Secondary | ICD-10-CM | POA: Diagnosis not present

## 2018-08-01 DIAGNOSIS — Z95828 Presence of other vascular implants and grafts: Secondary | ICD-10-CM | POA: Diagnosis not present

## 2018-08-01 DIAGNOSIS — I10 Essential (primary) hypertension: Secondary | ICD-10-CM | POA: Diagnosis not present

## 2018-08-12 DIAGNOSIS — Z9221 Personal history of antineoplastic chemotherapy: Secondary | ICD-10-CM | POA: Diagnosis not present

## 2018-08-12 DIAGNOSIS — R030 Elevated blood-pressure reading, without diagnosis of hypertension: Secondary | ICD-10-CM | POA: Diagnosis not present

## 2018-08-12 DIAGNOSIS — C099 Malignant neoplasm of tonsil, unspecified: Secondary | ICD-10-CM | POA: Diagnosis not present

## 2018-09-24 DIAGNOSIS — C109 Malignant neoplasm of oropharynx, unspecified: Secondary | ICD-10-CM | POA: Diagnosis not present

## 2018-09-24 DIAGNOSIS — J019 Acute sinusitis, unspecified: Secondary | ICD-10-CM | POA: Diagnosis not present

## 2018-09-29 DIAGNOSIS — Z452 Encounter for adjustment and management of vascular access device: Secondary | ICD-10-CM | POA: Diagnosis not present

## 2018-09-29 DIAGNOSIS — C099 Malignant neoplasm of tonsil, unspecified: Secondary | ICD-10-CM | POA: Diagnosis not present

## 2018-10-01 DIAGNOSIS — C099 Malignant neoplasm of tonsil, unspecified: Secondary | ICD-10-CM | POA: Diagnosis not present

## 2018-10-01 DIAGNOSIS — C109 Malignant neoplasm of oropharynx, unspecified: Secondary | ICD-10-CM | POA: Diagnosis not present

## 2018-10-01 DIAGNOSIS — C781 Secondary malignant neoplasm of mediastinum: Secondary | ICD-10-CM | POA: Diagnosis not present

## 2018-10-01 DIAGNOSIS — Z9221 Personal history of antineoplastic chemotherapy: Secondary | ICD-10-CM | POA: Diagnosis not present

## 2018-10-01 DIAGNOSIS — R918 Other nonspecific abnormal finding of lung field: Secondary | ICD-10-CM | POA: Diagnosis not present

## 2018-10-02 DIAGNOSIS — C108 Malignant neoplasm of overlapping sites of oropharynx: Secondary | ICD-10-CM | POA: Diagnosis not present

## 2018-10-02 DIAGNOSIS — Z9221 Personal history of antineoplastic chemotherapy: Secondary | ICD-10-CM | POA: Diagnosis not present

## 2018-10-02 DIAGNOSIS — C778 Secondary and unspecified malignant neoplasm of lymph nodes of multiple regions: Secondary | ICD-10-CM | POA: Diagnosis not present

## 2018-10-02 DIAGNOSIS — F17211 Nicotine dependence, cigarettes, in remission: Secondary | ICD-10-CM | POA: Diagnosis not present

## 2018-10-02 DIAGNOSIS — C109 Malignant neoplasm of oropharynx, unspecified: Secondary | ICD-10-CM | POA: Diagnosis not present

## 2018-10-03 DIAGNOSIS — C108 Malignant neoplasm of overlapping sites of oropharynx: Secondary | ICD-10-CM | POA: Diagnosis not present

## 2018-10-06 DIAGNOSIS — Z510 Encounter for antineoplastic radiation therapy: Secondary | ICD-10-CM | POA: Diagnosis not present

## 2018-10-06 DIAGNOSIS — C108 Malignant neoplasm of overlapping sites of oropharynx: Secondary | ICD-10-CM | POA: Diagnosis not present

## 2018-10-08 DIAGNOSIS — C108 Malignant neoplasm of overlapping sites of oropharynx: Secondary | ICD-10-CM | POA: Diagnosis not present

## 2018-10-08 DIAGNOSIS — C7800 Secondary malignant neoplasm of unspecified lung: Secondary | ICD-10-CM | POA: Diagnosis not present

## 2018-10-08 DIAGNOSIS — C109 Malignant neoplasm of oropharynx, unspecified: Secondary | ICD-10-CM | POA: Diagnosis not present

## 2018-10-08 DIAGNOSIS — Z510 Encounter for antineoplastic radiation therapy: Secondary | ICD-10-CM | POA: Diagnosis not present

## 2018-10-12 DIAGNOSIS — C108 Malignant neoplasm of overlapping sites of oropharynx: Secondary | ICD-10-CM | POA: Diagnosis not present

## 2018-10-12 DIAGNOSIS — Z510 Encounter for antineoplastic radiation therapy: Secondary | ICD-10-CM | POA: Diagnosis not present

## 2018-10-15 DIAGNOSIS — Z510 Encounter for antineoplastic radiation therapy: Secondary | ICD-10-CM | POA: Diagnosis not present

## 2018-10-15 DIAGNOSIS — C108 Malignant neoplasm of overlapping sites of oropharynx: Secondary | ICD-10-CM | POA: Diagnosis not present

## 2018-10-21 DIAGNOSIS — Z510 Encounter for antineoplastic radiation therapy: Secondary | ICD-10-CM | POA: Diagnosis not present

## 2018-10-21 DIAGNOSIS — C108 Malignant neoplasm of overlapping sites of oropharynx: Secondary | ICD-10-CM | POA: Diagnosis not present

## 2018-10-24 DIAGNOSIS — C108 Malignant neoplasm of overlapping sites of oropharynx: Secondary | ICD-10-CM | POA: Diagnosis not present

## 2018-10-24 DIAGNOSIS — Z510 Encounter for antineoplastic radiation therapy: Secondary | ICD-10-CM | POA: Diagnosis not present

## 2018-10-28 DIAGNOSIS — Z510 Encounter for antineoplastic radiation therapy: Secondary | ICD-10-CM | POA: Diagnosis not present

## 2018-10-28 DIAGNOSIS — C108 Malignant neoplasm of overlapping sites of oropharynx: Secondary | ICD-10-CM | POA: Diagnosis not present

## 2018-10-31 DIAGNOSIS — Z510 Encounter for antineoplastic radiation therapy: Secondary | ICD-10-CM | POA: Diagnosis not present

## 2018-10-31 DIAGNOSIS — C108 Malignant neoplasm of overlapping sites of oropharynx: Secondary | ICD-10-CM | POA: Diagnosis not present

## 2018-11-04 DIAGNOSIS — C108 Malignant neoplasm of overlapping sites of oropharynx: Secondary | ICD-10-CM | POA: Diagnosis not present

## 2018-11-04 DIAGNOSIS — Z510 Encounter for antineoplastic radiation therapy: Secondary | ICD-10-CM | POA: Diagnosis not present

## 2018-11-05 DIAGNOSIS — C109 Malignant neoplasm of oropharynx, unspecified: Secondary | ICD-10-CM | POA: Diagnosis not present

## 2018-11-05 DIAGNOSIS — K1230 Oral mucositis (ulcerative), unspecified: Secondary | ICD-10-CM | POA: Diagnosis not present

## 2018-11-05 DIAGNOSIS — I10 Essential (primary) hypertension: Secondary | ICD-10-CM | POA: Diagnosis not present

## 2018-11-05 DIAGNOSIS — Z95828 Presence of other vascular implants and grafts: Secondary | ICD-10-CM | POA: Diagnosis not present

## 2018-11-05 DIAGNOSIS — Z87891 Personal history of nicotine dependence: Secondary | ICD-10-CM | POA: Diagnosis not present

## 2018-11-07 DIAGNOSIS — Z510 Encounter for antineoplastic radiation therapy: Secondary | ICD-10-CM | POA: Diagnosis not present

## 2018-11-07 DIAGNOSIS — C108 Malignant neoplasm of overlapping sites of oropharynx: Secondary | ICD-10-CM | POA: Diagnosis not present

## 2018-11-10 DIAGNOSIS — Z510 Encounter for antineoplastic radiation therapy: Secondary | ICD-10-CM | POA: Diagnosis not present

## 2018-11-10 DIAGNOSIS — C108 Malignant neoplasm of overlapping sites of oropharynx: Secondary | ICD-10-CM | POA: Diagnosis not present

## 2018-11-11 DIAGNOSIS — Z510 Encounter for antineoplastic radiation therapy: Secondary | ICD-10-CM | POA: Diagnosis not present

## 2018-11-11 DIAGNOSIS — C108 Malignant neoplasm of overlapping sites of oropharynx: Secondary | ICD-10-CM | POA: Diagnosis not present

## 2018-11-14 DIAGNOSIS — Z510 Encounter for antineoplastic radiation therapy: Secondary | ICD-10-CM | POA: Diagnosis not present

## 2018-11-14 DIAGNOSIS — C108 Malignant neoplasm of overlapping sites of oropharynx: Secondary | ICD-10-CM | POA: Diagnosis not present

## 2018-12-02 DIAGNOSIS — C109 Malignant neoplasm of oropharynx, unspecified: Secondary | ICD-10-CM | POA: Diagnosis not present

## 2018-12-02 DIAGNOSIS — N2889 Other specified disorders of kidney and ureter: Secondary | ICD-10-CM | POA: Diagnosis not present

## 2018-12-02 DIAGNOSIS — Z87891 Personal history of nicotine dependence: Secondary | ICD-10-CM | POA: Diagnosis not present

## 2018-12-02 DIAGNOSIS — R590 Localized enlarged lymph nodes: Secondary | ICD-10-CM | POA: Diagnosis not present

## 2018-12-02 DIAGNOSIS — C760 Malignant neoplasm of head, face and neck: Secondary | ICD-10-CM | POA: Diagnosis not present

## 2018-12-09 DIAGNOSIS — R1310 Dysphagia, unspecified: Secondary | ICD-10-CM | POA: Diagnosis not present

## 2018-12-09 DIAGNOSIS — Z87891 Personal history of nicotine dependence: Secondary | ICD-10-CM | POA: Diagnosis not present

## 2018-12-09 DIAGNOSIS — R1312 Dysphagia, oropharyngeal phase: Secondary | ICD-10-CM | POA: Diagnosis not present

## 2018-12-09 DIAGNOSIS — K1230 Oral mucositis (ulcerative), unspecified: Secondary | ICD-10-CM | POA: Diagnosis not present

## 2018-12-09 DIAGNOSIS — I10 Essential (primary) hypertension: Secondary | ICD-10-CM | POA: Diagnosis not present

## 2018-12-09 DIAGNOSIS — C109 Malignant neoplasm of oropharynx, unspecified: Secondary | ICD-10-CM | POA: Diagnosis not present

## 2018-12-09 DIAGNOSIS — Z95828 Presence of other vascular implants and grafts: Secondary | ICD-10-CM | POA: Diagnosis not present

## 2018-12-09 DIAGNOSIS — Z5111 Encounter for antineoplastic chemotherapy: Secondary | ICD-10-CM | POA: Diagnosis not present

## 2018-12-10 DIAGNOSIS — C7800 Secondary malignant neoplasm of unspecified lung: Secondary | ICD-10-CM | POA: Diagnosis not present

## 2018-12-10 DIAGNOSIS — C109 Malignant neoplasm of oropharynx, unspecified: Secondary | ICD-10-CM | POA: Diagnosis not present

## 2018-12-30 DIAGNOSIS — Z5111 Encounter for antineoplastic chemotherapy: Secondary | ICD-10-CM | POA: Diagnosis not present

## 2018-12-30 DIAGNOSIS — K1230 Oral mucositis (ulcerative), unspecified: Secondary | ICD-10-CM | POA: Diagnosis not present

## 2018-12-30 DIAGNOSIS — R1310 Dysphagia, unspecified: Secondary | ICD-10-CM | POA: Diagnosis not present

## 2018-12-30 DIAGNOSIS — K1232 Oral mucositis (ulcerative) due to other drugs: Secondary | ICD-10-CM | POA: Diagnosis not present

## 2018-12-30 DIAGNOSIS — Z95828 Presence of other vascular implants and grafts: Secondary | ICD-10-CM | POA: Diagnosis not present

## 2018-12-30 DIAGNOSIS — T451X5A Adverse effect of antineoplastic and immunosuppressive drugs, initial encounter: Secondary | ICD-10-CM | POA: Diagnosis not present

## 2018-12-30 DIAGNOSIS — G893 Neoplasm related pain (acute) (chronic): Secondary | ICD-10-CM | POA: Diagnosis not present

## 2018-12-30 DIAGNOSIS — C099 Malignant neoplasm of tonsil, unspecified: Secondary | ICD-10-CM | POA: Diagnosis not present

## 2018-12-30 DIAGNOSIS — R634 Abnormal weight loss: Secondary | ICD-10-CM | POA: Diagnosis not present

## 2018-12-30 DIAGNOSIS — I10 Essential (primary) hypertension: Secondary | ICD-10-CM | POA: Diagnosis not present

## 2018-12-30 DIAGNOSIS — C109 Malignant neoplasm of oropharynx, unspecified: Secondary | ICD-10-CM | POA: Diagnosis not present

## 2020-01-14 ENCOUNTER — Ambulatory Visit: Payer: Medicare HMO | Attending: Internal Medicine

## 2020-01-14 DIAGNOSIS — Z23 Encounter for immunization: Secondary | ICD-10-CM | POA: Insufficient documentation

## 2020-01-14 NOTE — Progress Notes (Signed)
   Covid-19 Vaccination Clinic  Name:  Timothy Cox    MRN: [id number] DOB: [birth year]  01/14/2020  Mr. Timothy Cox was observed post Covid-19 immunization for 15 minutes without incidence. He was provided with Vaccine Information Sheet and instruction to access the V-Safe system.   Mr. Timothy Cox was instructed to call 911 with any severe reactions post vaccine: Marland Kitchen Difficulty breathing  . Swelling of your face and throat  . A fast heartbeat  . A bad rash all over your body  . Dizziness and weakness    Immunizations Administered    Name Date Dose VIS Date Route   Pfizer COVID-19 Vaccine 01/14/2020 10:02 AM 0.3 mL 10/30/2019 Intramuscular   Manufacturer: [place]   Lot: [id number]   [place]: [id number]

## 2020-02-03 ENCOUNTER — Ambulatory Visit: Payer: Medicare HMO | Attending: Internal Medicine

## 2020-02-03 DIAGNOSIS — Z23 Encounter for immunization: Secondary | ICD-10-CM

## 2020-02-03 NOTE — Progress Notes (Signed)
   Covid-19 Vaccination Clinic  Name:  Timothy Cox    MRN: [id number] DOB: 1946-02-23  02/03/2020  [name] was observed post Covid-19 immunization for 15 minutes without incident. He was provided with Vaccine Information Sheet and instruction to access the V-Safe system.   [name] was instructed to call 911 with any severe reactions post vaccine: Marland Kitchen Difficulty breathing  . Swelling of face and throat  . A fast heartbeat  . A bad rash all over body  . Dizziness and weakness   Immunizations Administered    Name Date Dose VIS Date Route   Pfizer COVID-19 Vaccine 02/03/2020 12:22 PM 0.3 mL 10/30/2019 Intramuscular   Manufacturer: [place]   Lot: [id number]   [place]: [id number]

## 2021-05-31 ENCOUNTER — Emergency Department
Admission: EM | Admit: 2021-05-31 | Discharge: 2021-05-31 | Disposition: A | Discharge status: Home / Self Care | Payer: Medicare HMO | Source: Home / Self Care | Attending: Family Medicine | Admitting: Family Medicine

## 2021-05-31 ENCOUNTER — Other Ambulatory Visit: Payer: Self-pay

## 2021-05-31 DIAGNOSIS — B029 Zoster without complications: Secondary | ICD-10-CM

## 2021-05-31 HISTORY — DX: Malignant (primary) neoplasm, unspecified: C80.1

## 2021-05-31 MED ORDER — VALACYCLOVIR HCL 1 G PO TABS: 1000.0000 mg | ORAL_TABLET | Freq: Three times a day (TID) | ORAL | 0 refills | Status: AC

## 2021-05-31 NOTE — ED Provider Notes (Signed)
[facility]    CSN: [id number] Arrival date & time: 05/31/21  1233      History   Chief Complaint Chief Complaint  Patient presents with   Rash    HPI Timothy Cox is a 75 y.o. male.   HPI  75 year old gentleman.  Diagnosed with tonsillar cancer in 2019.  Has had chemo and radiation.  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  He had biliary obstruction and increased liver functions with a bilirubin of 28.  Had an ERCP and stent placed.  States he generally feels well. He states he has had all of his vaccinations, except for the shingles vaccine. He states he has a rash that started on Sunday.  It advanced Monday and Tuesday.  He is here Wednesday morning for evaluation.  He states he called his cancer doctor who advised him to come for medical evaluation. I reviewed his last note.  His white count is little normal.  He states he has not felt or been told that he was immunocompromised but knows that he is being monitored for this.  He states he is still getting chemotherapy.  Past Medical History:  Diagnosis Date   Cancer ([place])    tonsils   Fractured elbow    GERD (gastroesophageal reflux disease)    Pinched nerve     There are no problems to display for this patient.   History reviewed. No pertinent surgical history.     Home Medications    Prior to Admission medications   Medication Sig Start Date End Date Taking? Authorizing Provider  levothyroxine (SYNTHROID) 112 MCG tablet TAKE 1 TABLET(112 MCG) BY MOUTH DAILY 10/20/20  Yes [provider]  valACYclovir (VALTREX) 1000 MG tablet Take 1 tablet (1,000 mg total) by mouth 3 (three) times daily. 05/31/21  Yes Raylene Everts, MD  cholecalciferol (VITAMIN D) 1000 UNITS tablet Take 1,000 Units by mouth daily.    [provider]  Garlic 419 MG TABS Take 1 tablet by mouth daily.      [provider]  ibuprofen (ADVIL,MOTRIN) 200 MG tablet Take  400 mg by mouth as needed. Pain      [provider]  loratadine-pseudoephedrine (CLARITIN-D 24-HOUR) 10-240 MG per 24 hr tablet Take 1 tablet by mouth as needed. Seasonal allergies     [provider]  Multiple Vitamin (MULTIVITAMIN) tablet Take 1 tablet by mouth daily.      [provider]    Family History Family History  Problem Relation Age of Onset   Diabetes Mellitus I Mother    Heart disease Mother     Social History Social History   Tobacco Use   Smoking status: Former    Pack years: 0.00    Types: Cigarettes    Quit date: 04/27/2010    Years since quitting: 11.1   Smokeless tobacco: Never  Vaping Use   Vaping Use: Never used  Substance Use Topics   Alcohol use: Not Currently   Drug use: Not Currently     Allergies   Patient has no known allergies.   Review of Systems Review of Systems  See HPI Physical Exam Triage Vital Signs ED Triage Vitals  Enc Vitals Group     BP 05/31/21 1320 (!) 145/79     Pulse Rate 05/31/21 1320 70     Resp 05/31/21 1320 20     Temp 05/31/21 1320 98 F (36.7 C)     Temp Source  05/31/21 1320 Oral     SpO2 05/31/21 1320 98 %     Weight 05/31/21 1320 167 lb (75.8 kg)     Height 05/31/21 1320 5' 9.5" (1.765 m)     Head Circumference --      Peak Flow --      Pain Score 05/31/21 1319 0     Pain Loc --      Pain Edu? --      Excl. in [place]? --    No data found.  Updated Vital Signs BP (!) 145/79 (BP Location: Right Arm)   Pulse 70   Temp 98 F (36.7 C) (Oral)   Resp 20   Ht 5' 9.5" (1.765 m)   Wt 75.8 kg   SpO2 98%   BMI 24.31 kg/m      Physical Exam Constitutional:      General: He is not in acute distress.    Appearance: He is well-developed and normal weight.  HENT:     Head: Normocephalic and atraumatic.     Mouth/Throat:     Comments: Mask is in place Eyes:     Conjunctiva/sclera: Conjunctivae normal.     Pupils: Pupils are equal, round, and reactive to light.  Cardiovascular:      Rate and Rhythm: Normal rate.  Pulmonary:     Effort: Pulmonary effort is normal. No respiratory distress.  Abdominal:     General: There is no distension.     Palpations: Abdomen is soft.  Musculoskeletal:        General: Normal range of motion.     Cervical back: Normal range of motion.  Skin:    General: Skin is warm and dry.     Findings: Rash present.     Comments: See photo.  Rash extends from midline over thoracic spine all the way around to just below the umbilicus.  It is more concentrated on the back than the front.  Neurological:     General: No focal deficit present.     Mental Status: He is alert.  Psychiatric:        Mood and Affect: Mood normal.        Behavior: Behavior normal.      UC Treatments / Results  Labs (all labs ordered are listed, but only abnormal results are displayed) Labs Reviewed - No data to display  EKG   Radiology No results found.  Procedures Procedures (including critical care time)  Medications Ordered in UC Medications - No data to display  Initial Impression / Assessment and Plan / UC Course  I have reviewed the triage vital signs and the nursing notes.  Pertinent labs & imaging results that were available during my care of the patient were reviewed by me and considered in my medical decision making (see chart for details).     The patient has significant shingles this is seen in the photograph.  He is treated with valacyclovir.  Fortunately he states he does not have any pain or itching in this rash.  Discussed contagiousness.  Discussed that he must call his cancer provider to get additional advice. Final Clinical Impressions(s) / UC Diagnoses   Final diagnoses:  Herpes zoster without complication     Discharge Instructions      Take Tylenol as needed for pain Take the antiviral medicine 3 times a day as directed Call your cancer doctor for additional advice     ED Prescriptions     Medication Sig  Dispense  Auth. Provider   valACYclovir (VALTREX) 1000 MG tablet Take 1 tablet (1,000 mg total) by mouth 3 (three) times daily. 21 tablet Raylene Everts, MD      PDMP not reviewed this encounter.   Raylene Everts, MD 05/31/21 1416

## 2021-05-31 NOTE — ED Triage Notes (Signed)
Pt presents to Urgent Care with c/o rash to L side of abdomen, suspects shingles. Denies pain and itching.

## 2021-05-31 NOTE — Discharge Instructions (Addendum)
Take Tylenol as needed for pain Take the antiviral medicine 3 times a day as directed Call your cancer doctor for additional advice
